# Patient Record
Sex: Female | Born: 1995 | Race: Black or African American | Hispanic: No | Marital: Single | Smoking: Never smoker
Health system: Southern US, Community
[De-identification: ages and names within clinical notes are randomized; demographics above are authoritative.]

## PROBLEM LIST (undated history)

## (undated) DIAGNOSIS — J302 Other seasonal allergic rhinitis: Secondary | ICD-10-CM

---

## 2015-07-24 ENCOUNTER — Inpatient Hospital Stay (HOSPITAL_COMMUNITY)
Admission: AD | Admit: 2015-07-24 | Discharge: 2015-07-25 | Disposition: A | Discharge status: Home / Self Care | Payer: Federal, State, Local not specified - PPO | Source: Ambulatory Visit | Attending: Emergency Medicine | Admitting: Emergency Medicine

## 2015-07-24 DIAGNOSIS — M542 Cervicalgia: Secondary | ICD-10-CM | POA: Diagnosis not present

## 2015-07-24 DIAGNOSIS — S161XXA Strain of muscle, fascia and tendon at neck level, initial encounter: Secondary | ICD-10-CM

## 2015-07-24 DIAGNOSIS — Y92410 Unspecified street and highway as the place of occurrence of the external cause: Secondary | ICD-10-CM | POA: Insufficient documentation

## 2015-07-24 DIAGNOSIS — R51 Headache: Secondary | ICD-10-CM | POA: Diagnosis not present

## 2015-07-24 HISTORY — DX: Other seasonal allergic rhinitis: J30.2

## 2015-07-24 LAB — URINALYSIS, ROUTINE W REFLEX MICROSCOPIC
BILIRUBIN URINE: NEGATIVE
GLUCOSE, UA: NEGATIVE mg/dL
HGB URINE DIPSTICK: NEGATIVE
KETONES UR: 15 mg/dL — AB
Leukocytes, UA: NEGATIVE
Nitrite: NEGATIVE
PH: 6 (ref 5.0–8.0)
Protein, ur: NEGATIVE mg/dL
Specific Gravity, Urine: >1.03 — ABNORMAL HIGH (ref 1.005–1.030)
Urobilinogen, UA: 0.2 mg/dL (ref 0.0–1.0)

## 2015-07-24 LAB — POCT PREGNANCY, URINE: Preg Test, Ur: NEGATIVE
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
## 2015-07-24 NOTE — ED Notes (Signed)
[facility] [place]  [facility]

## 2015-07-25 ENCOUNTER — Inpatient Hospital Stay (HOSPITAL_COMMUNITY): Payer: Federal, State, Local not specified - PPO

## 2015-07-25 ENCOUNTER — Encounter (HOSPITAL_COMMUNITY): Payer: Self-pay | Admitting: Emergency Medicine

## 2015-07-25 MED ORDER — TRAMADOL HCL 50 MG PO TABS: 50.0000 mg | ORAL_TABLET | Freq: Four times a day (QID) | ORAL | Status: AC | PRN

## 2015-07-25 MED ORDER — IBUPROFEN 400 MG PO TABS
600.0000 mg | ORAL_TABLET | Freq: Once | ORAL | Status: AC
  Administered 2015-07-25: 600 mg via ORAL
  Filled 2015-07-25 (×2): qty 1

## 2015-07-25 MED ORDER — METHOCARBAMOL 500 MG PO TABS: 500.0000 mg | ORAL_TABLET | Freq: Three times a day (TID) | ORAL | Status: AC | PRN

## 2015-07-25 MED ORDER — IBUPROFEN 600 MG PO TABS: 600.0000 mg | ORAL_TABLET | Freq: Four times a day (QID) | ORAL | Status: AC | PRN

## 2015-07-25 MED ORDER — METHOCARBAMOL 500 MG PO TABS
500.0000 mg | ORAL_TABLET | Freq: Once | ORAL | Status: AC
  Administered 2015-07-25: 500 mg via ORAL
  Filled 2015-07-25: qty 1

## 2015-07-25 MED ORDER — TRAMADOL HCL 50 MG PO TABS
50.0000 mg | ORAL_TABLET | Freq: Once | ORAL | Status: AC
  Administered 2015-07-25: 50 mg via ORAL
  Filled 2015-07-25: qty 1

## 2015-07-25 NOTE — Discharge Instructions (Signed)
Cervical Sprain °A cervical sprain is an injury in the neck in which the strong, fibrous tissues (ligaments) that connect your neck bones stretch or tear. Cervical sprains can range from mild to severe. Severe cervical sprains can cause the neck vertebrae to be unstable. This can lead to damage of the spinal cord and can result in serious nervous system problems. The amount of time it takes for a cervical sprain to get better depends on the cause and extent of the injury. Most cervical sprains heal in 1 to 3 weeks. °CAUSES  °Severe cervical sprains may be caused by:  °· Contact sport injuries (such as from football, rugby, wrestling, hockey, auto racing, gymnastics, diving, martial arts, or boxing).   °· Motor vehicle collisions.   °· Whiplash injuries. This is an injury from a sudden forward and backward whipping movement of the head and neck.  °· Falls.   °Mild cervical sprains may be caused by:  °· Being in an awkward position, such as while cradling a telephone between your ear and shoulder.   °· Sitting in a chair that does not offer proper support.   °· Working at a poorly designed computer station.   °· Looking up or down for long periods of time.   °SYMPTOMS  °· Pain, soreness, stiffness, or a burning sensation in the front, back, or sides of the neck. This discomfort may develop immediately after the injury or slowly, 24 hours or more after the injury.   °· Pain or tenderness directly in the middle of the back of the neck.   °· Shoulder or upper back pain.   °· Limited ability to move the neck.   °· Headache.   °· Dizziness.   °· Weakness, numbness, or tingling in the hands or arms.   °· Muscle spasms.   °· Difficulty swallowing or chewing.   °· Tenderness and swelling of the neck.   °DIAGNOSIS  °Most of the time your health care provider can diagnose a cervical sprain by taking your history and doing a physical exam. Your health care provider will ask about previous neck injuries and any known neck  problems, such as arthritis in the neck. X-rays may be taken to find out if there are any other problems, such as with the bones of the neck. Other tests, such as a CT scan or MRI, may also be needed.  °TREATMENT  °Treatment depends on the severity of the cervical sprain. Mild sprains can be treated with rest, keeping the neck in place (immobilization), and pain medicines. Severe cervical sprains are immediately immobilized. Further treatment is done to help with pain, muscle spasms, and other symptoms and may include: °· Medicines, such as pain relievers, numbing medicines, or muscle relaxants.   °· Physical therapy. This may involve stretching exercises, strengthening exercises, and posture training. Exercises and improved posture can help stabilize the neck, strengthen muscles, and help stop symptoms from returning.   °HOME CARE INSTRUCTIONS  °· Put ice on the injured area.   °¨ Put ice in a plastic bag.   °¨ Place a towel between your skin and the bag.   °¨ Leave the ice on for 15-20 minutes, 3-4 times a day.   °· If your injury was severe, you may have been given a cervical collar to wear. A cervical collar is a two-piece collar designed to keep your neck from moving while it heals. °¨ Do not remove the collar unless instructed by your health care provider. °¨ If you have long hair, keep it outside of the collar. °¨ Ask your health care provider before making any adjustments to your collar. Minor   adjustments may be required over time to improve comfort and reduce pressure on your chin or on the back of your head. °¨ If you are allowed to remove the collar for cleaning or bathing, follow your health care provider's instructions on how to do so safely. °¨ Keep your collar clean by wiping it with mild soap and water and drying it completely. If the collar you have been given includes removable pads, remove them every 1-2 days and hand wash them with soap and water. Allow them to air dry. They should be completely  dry before you wear them in the collar. °¨ If you are allowed to remove the collar for cleaning and bathing, wash and dry the skin of your neck. Check your skin for irritation or sores. If you see any, tell your health care provider. °¨ Do not drive while wearing the collar.   °· Only take over-the-counter or prescription medicines for pain, discomfort, or fever as directed by your health care provider.   °· Keep all follow-up appointments as directed by your health care provider.   °· Keep all physical therapy appointments as directed by your health care provider.   °· Make any needed adjustments to your workstation to promote good posture.   °· Avoid positions and activities that make your symptoms worse.   °· Warm up and stretch before being active to help prevent problems.   °SEEK MEDICAL CARE IF:  °· Your pain is not controlled with medicine.   °· You are unable to decrease your pain medicine over time as planned.   °· Your activity level is not improving as expected.   °SEEK IMMEDIATE MEDICAL CARE IF:  °· You develop any bleeding. °· You develop stomach upset. °· You have signs of an allergic reaction to your medicine.   °· Your symptoms get worse.   °· You develop new, unexplained symptoms.   °· You have numbness, tingling, weakness, or paralysis in any part of your body.   °MAKE SURE YOU:  °· Understand these instructions. °· Will watch your condition. °· Will get help right away if you are not doing well or get worse. °  °This information is not intended to replace advice given to you by your health care provider. Make sure you discuss any questions you have with your health care provider. °  °Document Released: 07/19/2007 Document Revised: 09/26/2013 Document Reviewed: 03/29/2013 °Elsevier Interactive Patient Education ©2016 [employer] ° °Motor Vehicle Collision °It is common to have multiple bruises and sore muscles after a motor vehicle collision (MVC). These tend to feel worse for the first 24 hours.  You may have the most stiffness and soreness over the first several hours. You may also feel worse when you wake up the first morning after your collision. After this point, you will usually begin to improve with each day. The speed of improvement often depends on the severity of the collision, the number of injuries, and the location and nature of these injuries. °HOME CARE INSTRUCTIONS °· Put ice on the injured area. °· Put ice in a plastic bag. °· Place a towel between your skin and the bag. °· Leave the ice on for 15-20 minutes, 3-4 times a day, or as directed by your health care provider. °· Drink enough fluids to keep your urine clear or pale yellow. Do not drink alcohol. °· Take a warm shower or bath once or twice a day. This will increase blood flow to sore muscles. °· You may return to activities as directed by your caregiver. Be careful when lifting, as this   may aggravate neck or back pain. °· Only take over-the-counter or prescription medicines for pain, discomfort, or fever as directed by your caregiver. Do not use aspirin. This may increase bruising and bleeding. °SEEK IMMEDIATE MEDICAL CARE IF: °· You have numbness, tingling, or weakness in the arms or legs. °· You develop severe headaches not relieved with medicine. °· You have severe neck pain, especially tenderness in the middle of the back of your neck. °· You have changes in bowel or bladder control. °· There is increasing pain in any area of the body. °· You have shortness of breath, light-headedness, dizziness, or fainting. °· You have chest pain. °· You feel sick to your stomach (nauseous), throw up (vomit), or sweat. °· You have increasing abdominal discomfort. °· There is blood in your urine, stool, or vomit. °· You have pain in your shoulder (shoulder strap areas). °· You feel your symptoms are getting worse. °MAKE SURE YOU: °· Understand these instructions. °· Will watch your condition. °· Will get help right away if you are not doing well  or get worse. °  °This information is not intended to replace advice given to you by your health care provider. Make sure you discuss any questions you have with your health care provider. °  °Document Released: 09/21/2005 Document Revised: 10/12/2014 Document Reviewed: 02/18/2011 °Elsevier Interactive Patient Education ©2016 [employer] ° °Muscle Strain °A muscle strain is an injury that occurs when a muscle is stretched beyond its normal length. Usually a small number of muscle fibers are torn when this happens. Muscle strain is rated in degrees. First-degree strains have the least amount of muscle fiber tearing and pain. Second-degree and third-degree strains have increasingly more tearing and pain.  °Usually, recovery from muscle strain takes 1-2 weeks. Complete healing takes 5-6 weeks.  °CAUSES  °Muscle strain happens when a sudden, violent force placed on a muscle stretches it too far. This may occur with lifting, sports, or a fall.  °RISK FACTORS °Muscle strain is especially common in athletes.  °SIGNS AND SYMPTOMS °At the site of the muscle strain, there may be: °· Pain. °· Bruising. °· Swelling. °· Difficulty using the muscle due to pain or lack of normal function. °DIAGNOSIS  °Your health care provider will perform a physical exam and ask about your medical history. °TREATMENT  °Often, the best treatment for a muscle strain is resting, icing, and applying cold compresses to the injured area.   °HOME CARE INSTRUCTIONS  °· Use the PRICE method of treatment to promote muscle healing during the first 2-3 days after your injury. The PRICE method involves: °¨ Protecting the muscle from being injured again. °¨ Restricting your activity and resting the injured body part. °¨ Icing your injury. To do this, put ice in a plastic bag. Place a towel between your skin and the bag. Then, apply the ice and leave it on from 15-20 minutes each hour. After the third day, switch to moist heat packs. °¨ Apply compression to  the injured area with a splint or elastic bandage. Be careful not to wrap it too tightly. This may interfere with blood circulation or increase swelling. °¨ Elevate the injured body part above the level of your heart as often as you can. °· Only take over-the-counter or prescription medicines for pain, discomfort, or fever as directed by your health care provider. °· Warming up prior to exercise helps to prevent future muscle strains. °SEEK MEDICAL CARE IF:  °· You have increasing pain or swelling in   the injured area. °· You have numbness, tingling, or a significant loss of strength in the injured area. °MAKE SURE YOU:  °· Understand these instructions. °· Will watch your condition. °· Will get help right away if you are not doing well or get worse. °  °This information is not intended to replace advice given to you by your health care provider. Make sure you discuss any questions you have with your health care provider. °  °Document Released: 09/21/2005 Document Revised: 07/12/2013 Document Reviewed: 04/20/2013 °Elsevier Interactive Patient Education ©2016 [employer] ° °
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
